# Patient Record
Sex: Male | Born: 1990 | Race: Black or African American | Hispanic: No | Marital: Single | Smoking: Never smoker
Health system: Southern US, Community
[De-identification: ages and names within clinical notes are randomized; demographics above are authoritative.]

---
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
## 2014-12-01 DIAGNOSIS — H9203 Otalgia, bilateral: Secondary | ICD-10-CM | POA: Insufficient documentation

## 2014-12-01 DIAGNOSIS — B349 Viral infection, unspecified: Secondary | ICD-10-CM | POA: Insufficient documentation

## 2014-12-02 ENCOUNTER — Emergency Department (HOSPITAL_BASED_OUTPATIENT_CLINIC_OR_DEPARTMENT_OTHER)
Admission: EM | Admit: 2014-12-02 | Discharge: 2014-12-02 | Disposition: A | Discharge status: Home / Self Care | Payer: Self-pay | Attending: Emergency Medicine | Admitting: Emergency Medicine

## 2014-12-02 ENCOUNTER — Encounter (HOSPITAL_BASED_OUTPATIENT_CLINIC_OR_DEPARTMENT_OTHER): Payer: Self-pay | Admitting: Emergency Medicine

## 2014-12-02 DIAGNOSIS — B349 Viral infection, unspecified: Secondary | ICD-10-CM

## 2014-12-02 MED ORDER — IBUPROFEN 800 MG PO TABS
800.0000 mg | ORAL_TABLET | Freq: Once | ORAL | Status: AC
  Administered 2014-12-02: 800 mg via ORAL
  Filled 2014-12-02: qty 1

## 2014-12-02 MED ORDER — ONDANSETRON 8 MG PO TBDP
8.0000 mg | ORAL_TABLET | Freq: Once | ORAL | Status: AC
  Administered 2014-12-02: 8 mg via ORAL
  Filled 2014-12-02: qty 1

## 2014-12-02 MED ORDER — FLUTICASONE PROPIONATE 50 MCG/ACT NA SUSP: 2.0000 | Freq: Every day | NASAL | Status: DC

## 2014-12-02 MED ORDER — ONDANSETRON 8 MG PO TBDP: ORAL_TABLET | ORAL | Status: DC

## 2014-12-02 MED ORDER — IBUPROFEN 800 MG PO TABS
800.0000 mg | ORAL_TABLET | Freq: Three times a day (TID) | ORAL | Status: DC
Start: 2014-12-02 — End: 2015-11-06

## 2014-12-02 NOTE — ED Notes (Signed)
Body aches, fatigue, fever, vomiting x4, ears ache, sore throat. Started 4/14.

## 2014-12-02 NOTE — ED Provider Notes (Addendum)
CSN: [id number]     Arrival date & time 12/01/14  2359 History   First MD Initiated Contact with Patient 12/02/14 0041     Chief Complaint  Patient presents with  . flu-like symptoms      (Consider location/radiation/quality/duration/timing/severity/associated sxs/prior Treatment) Patient is a 24 y.o. male presenting with ear pain. The history is provided by the patient.  Otalgia Location:  Bilateral Behind ear:  No abnormality Quality:  Aching Severity:  Moderate Onset quality:  Gradual Duration:  1 day Timing:  Constant Progression:  Unchanged Chronicity:  New Context: not direct blow   Relieved by:  Nothing Worsened by:  Nothing tried Ineffective treatments:  None tried Associated symptoms: diarrhea, sore throat and vomiting   Associated symptoms: no cough, no fever, no headaches, no neck pain and no rash   Risk factors: no recent travel     History reviewed. No pertinent past medical history. History reviewed. No pertinent past surgical history. History reviewed. No pertinent family history. History  Substance Use Topics  . Smoking status: Never Smoker   . Smokeless tobacco: Not on file  . Alcohol Use: No    Review of Systems  Constitutional: Negative for fever.  HENT: Positive for ear pain and sore throat. Negative for drooling.   Respiratory: Negative for cough.   Gastrointestinal: Positive for vomiting and diarrhea.  Musculoskeletal: Negative for neck pain.  Skin: Negative for rash.  Neurological: Negative for headaches.  All other systems reviewed and are negative.     Allergies  Review of patient's allergies indicates no known allergies.  Home Medications   Prior to Admission medications   Not on File   BP 146/82 mmHg  Pulse 72  Temp(Src) 98.4 F (36.9 C) (Oral)  Resp 16  Ht 6' (1.829 m)  Wt 205 lb (92.987 kg)  BMI 27.80 kg/m2  SpO2 100% Physical Exam  Constitutional: He is oriented to person, place, and time. He appears well-developed  and well-nourished. No distress.  HENT:  Head: Normocephalic and atraumatic.  Right Ear: External ear normal.  Left Ear: External ear normal.  Mouth/Throat: Oropharynx is clear and moist. No oropharyngeal exudate.  Eyes: Conjunctivae are normal. Pupils are equal, round, and reactive to light.  Neck: Normal range of motion. Neck supple. No tracheal deviation present.  Cardiovascular: Normal rate, regular rhythm and intact distal pulses.   Pulmonary/Chest: Effort normal and breath sounds normal. He has no wheezes. He has no rales.  Abdominal: Soft. Bowel sounds are normal. There is no tenderness. There is no rebound and no guarding.  Musculoskeletal: Normal range of motion. He exhibits no edema or tenderness.  Lymphadenopathy:    He has no cervical adenopathy.  Neurological: He is alert and oriented to person, place, and time.  Skin: Skin is warm and dry.  Psychiatric: He has a normal mood and affect.    ED Course  Procedures (including critical care time) Labs Review Labs Reviewed - No data to display  Imaging Review No results found.   EKG Interpretation None      MDM   Final diagnoses:  None    Viral syndrome, sick contacts at home.  Well appearing no indication for labs or imaging at this time.  Will treat symptomatically    Ramere Downs, MD 12/02/14 [phone]  Cy BlamerApril Tameron Lama, MD 12/02/14 [phone]

## 2015-08-14 ENCOUNTER — Encounter (HOSPITAL_BASED_OUTPATIENT_CLINIC_OR_DEPARTMENT_OTHER): Payer: Self-pay

## 2015-08-14 ENCOUNTER — Emergency Department (HOSPITAL_BASED_OUTPATIENT_CLINIC_OR_DEPARTMENT_OTHER)
Admission: EM | Admit: 2015-08-14 | Discharge: 2015-08-14 | Disposition: A | Discharge status: Home / Self Care | Payer: Self-pay | Attending: Emergency Medicine | Admitting: Emergency Medicine

## 2015-08-14 DIAGNOSIS — Z7951 Long term (current) use of inhaled steroids: Secondary | ICD-10-CM | POA: Insufficient documentation

## 2015-08-14 DIAGNOSIS — R112 Nausea with vomiting, unspecified: Secondary | ICD-10-CM | POA: Insufficient documentation

## 2015-08-14 DIAGNOSIS — R000 Tachycardia, unspecified: Secondary | ICD-10-CM | POA: Insufficient documentation

## 2015-08-14 DIAGNOSIS — R630 Anorexia: Secondary | ICD-10-CM | POA: Insufficient documentation

## 2015-08-14 DIAGNOSIS — R6883 Chills (without fever): Secondary | ICD-10-CM | POA: Insufficient documentation

## 2015-08-14 DIAGNOSIS — R1084 Generalized abdominal pain: Secondary | ICD-10-CM | POA: Insufficient documentation

## 2015-08-14 DIAGNOSIS — R197 Diarrhea, unspecified: Secondary | ICD-10-CM | POA: Insufficient documentation

## 2015-08-14 DIAGNOSIS — R5383 Other fatigue: Secondary | ICD-10-CM | POA: Insufficient documentation

## 2015-08-14 DIAGNOSIS — Z791 Long term (current) use of non-steroidal anti-inflammatories (NSAID): Secondary | ICD-10-CM | POA: Insufficient documentation

## 2015-08-14 LAB — URINALYSIS, ROUTINE W REFLEX MICROSCOPIC
BILIRUBIN URINE: NEGATIVE
Glucose, UA: NEGATIVE mg/dL
Hgb urine dipstick: NEGATIVE
KETONES UR: 15 mg/dL — AB
Leukocytes, UA: NEGATIVE
Nitrite: NEGATIVE
Protein, ur: NEGATIVE mg/dL
SPECIFIC GRAVITY, URINE: 1.035 — AB (ref 1.005–1.030)
pH: 6 (ref 5.0–8.0)

## 2015-08-14 LAB — CBC WITH DIFFERENTIAL/PLATELET
BASOS PCT: 0 %
Basophils Absolute: 0 10*3/uL (ref 0.0–0.1)
EOS ABS: 0 10*3/uL (ref 0.0–0.7)
Eosinophils Relative: 0 %
HCT: 45.9 % (ref 39.0–52.0)
Hemoglobin: 15.1 g/dL (ref 13.0–17.0)
LYMPHS ABS: 0.5 10*3/uL — AB (ref 0.7–4.0)
Lymphocytes Relative: 5 %
MCH: 26.8 pg (ref 26.0–34.0)
MCHC: 32.9 g/dL (ref 30.0–36.0)
MCV: 81.4 fL (ref 78.0–100.0)
MONOS PCT: 5 %
Monocytes Absolute: 0.6 10*3/uL (ref 0.1–1.0)
NEUTROS ABS: 10.6 10*3/uL — AB (ref 1.7–7.7)
NEUTROS PCT: 90 %
PLATELETS: 209 10*3/uL (ref 150–400)
RBC: 5.64 MIL/uL (ref 4.22–5.81)
RDW: 14.2 % (ref 11.5–15.5)
WBC: 11.8 10*3/uL — ABNORMAL HIGH (ref 4.0–10.5)

## 2015-08-14 LAB — COMPREHENSIVE METABOLIC PANEL
ALBUMIN: 4.1 g/dL (ref 3.5–5.0)
ALK PHOS: 87 U/L (ref 38–126)
ALT: 23 U/L (ref 17–63)
AST: 24 U/L (ref 15–41)
Anion gap: 5 (ref 5–15)
BUN: 17 mg/dL (ref 6–20)
CALCIUM: 9.3 mg/dL (ref 8.9–10.3)
CHLORIDE: 101 mmol/L (ref 101–111)
CO2: 28 mmol/L (ref 22–32)
CREATININE: 1.79 mg/dL — AB (ref 0.61–1.24)
GFR calc Af Amer: >60 mL/min (ref >60)
GFR calc non Af Amer: 52 mL/min — ABNORMAL LOW (ref >60)
GLUCOSE: 132 mg/dL — AB (ref 65–99)
Potassium: 4.3 mmol/L (ref 3.5–5.1)
SODIUM: 134 mmol/L — AB (ref 135–145)
Total Bilirubin: 0.7 mg/dL (ref 0.3–1.2)
Total Protein: 7.9 g/dL (ref 6.5–8.1)

## 2015-08-14 LAB — LIPASE, BLOOD: Lipase: 23 U/L (ref 11–51)

## 2015-08-14 MED ORDER — FENTANYL CITRATE (PF) 100 MCG/2ML IJ SOLN
100.0000 ug | Freq: Once | INTRAMUSCULAR | Status: AC
  Administered 2015-08-14: 100 ug via INTRAVENOUS
  Filled 2015-08-14: qty 2

## 2015-08-14 MED ORDER — SODIUM CHLORIDE 0.9 % IV BOLUS (SEPSIS)
1000.0000 mL | Freq: Once | INTRAVENOUS | Status: AC
  Administered 2015-08-14: 1000 mL via INTRAVENOUS

## 2015-08-14 MED ORDER — ONDANSETRON 8 MG PO TBDP: 8.0000 mg | ORAL_TABLET | Freq: Three times a day (TID) | ORAL | Status: DC | PRN

## 2015-08-14 MED ORDER — PROMETHAZINE HCL 25 MG PO TABS: 25.0000 mg | ORAL_TABLET | Freq: Four times a day (QID) | ORAL | Status: DC | PRN

## 2015-08-14 MED ORDER — ONDANSETRON HCL 4 MG/2ML IJ SOLN
4.0000 mg | Freq: Once | INTRAMUSCULAR | Status: AC
  Administered 2015-08-14: 4 mg via INTRAVENOUS
  Filled 2015-08-14: qty 2

## 2015-08-14 NOTE — Discharge Instructions (Signed)

## 2015-08-14 NOTE — ED Provider Notes (Signed)
CSN: [id number]     Arrival date & time 08/14/15  0535 History   First MD Initiated Contact with Patient 08/14/15 0544     Chief Complaint  Patient presents with  . Abdominal Pain     (Consider location/radiation/quality/duration/timing/severity/associated sxs/prior Treatment) Patient is a 24 y.o. male presenting with vomiting. The history is provided by the patient.  Emesis Associated symptoms: abdominal pain and diarrhea   Associated symptoms: no headaches    patient presents with nausea vomiting diarrhea. Began yesterday. Has diffuse abdominal pain also. States it hurts all over. Has had a decreased appetite. States he's had some chills. States his mom also began to vomit. No blood in the emesis. The pain is still constant. Not relieved with anything. States he is otherwise healthy.  History reviewed. No pertinent past medical history. Past Surgical History  Procedure Laterality Date  . Fracture surgery  1998    left hand   No family history on file. Social History  Substance Use Topics  . Smoking status: Never Smoker   . Smokeless tobacco: None  . Alcohol Use: No    Review of Systems  Constitutional: Positive for appetite change and fatigue. Negative for activity change.  Eyes: Negative for pain.  Respiratory: Negative for chest tightness and shortness of breath.   Cardiovascular: Negative for chest pain and leg swelling.  Gastrointestinal: Positive for nausea, vomiting, abdominal pain and diarrhea.  Genitourinary: Negative for flank pain.  Musculoskeletal: Negative for back pain and neck stiffness.  Skin: Negative for rash.  Neurological: Negative for weakness, numbness and headaches.  Psychiatric/Behavioral: Negative for behavioral problems.      Allergies  Review of patient's allergies indicates no known allergies.  Home Medications   Prior to Admission medications   Medication Sig Start Date End Date Taking? Authorizing Provider  fluticasone (FLONASE) 50  MCG/ACT nasal spray Place 2 sprays into both nostrils daily. 12/02/14   April Palumbo, MD  ibuprofen (ADVIL,MOTRIN) 800 MG tablet Take 1 tablet (800 mg total) by mouth 3 (three) times daily. 12/02/14   April Palumbo, MD  ondansetron (ZOFRAN-ODT) 8 MG disintegrating tablet Take 1 tablet (8 mg total) by mouth every 8 (eight) hours as needed for nausea or vomiting. 08/14/15   Benjiman CoreNathan Kailee Essman, MD  promethazine (PHENERGAN) 25 MG tablet Take 1 tablet (25 mg total) by mouth every 6 (six) hours as needed for nausea. 08/14/15   Benjiman CoreNathan Taiwan Millon, MD   BP 133/59 mmHg  Pulse 105  Temp(Src) 100.1 F (37.8 C) (Oral)  Resp 20  Ht 6' (1.829 m)  Wt 230 lb (104.327 kg)  BMI 31.19 kg/m2  SpO2 100% Physical Exam  Constitutional: He appears well-developed.  HENT:  Head: Atraumatic.  Eyes: Pupils are equal, round, and reactive to light.  Neck: Neck supple.  Cardiovascular:  Tachycardia  Pulmonary/Chest: Effort normal.  Abdominal:  Diffusely tender. Some voluntary guarding.  Musculoskeletal: Normal range of motion.  Neurological: He is alert.  Skin: Skin is warm.  Nursing note and vitals reviewed.   ED Course  Procedures (including critical care time) Labs Review Labs Reviewed  CBC WITH DIFFERENTIAL/PLATELET - Abnormal; Notable for the following:    WBC 11.8 (*)    Neutro Abs 10.6 (*)    Lymphs Abs 0.5 (*)    All other components within normal limits  COMPREHENSIVE METABOLIC PANEL - Abnormal; Notable for the following:    Sodium 134 (*)    Glucose, Bld 132 (*)    Creatinine, Ser 1.79 (*)  GFR calc non Af Amer 52 (*)    All other components within normal limits  URINALYSIS, ROUTINE W REFLEX MICROSCOPIC (NOT AT [facility]) - Abnormal; Notable for the following:    Specific Gravity, Urine 1.035 (*)    Ketones, ur 15 (*)    All other components within normal limits  LIPASE, BLOOD    Imaging Review No results found. I have personally reviewed and evaluated these images and lab results as part  of my medical decision-making.   EKG Interpretation None      MDM   Final diagnoses:  Nausea vomiting and diarrhea    Patient nausea vomiting diarrhea. Mild dehydration. Feels better after treatment. Will discharge home.    Benjiman Core, MD 08/15/15 [phone]

## 2015-08-14 NOTE — ED Notes (Signed)
Pt c/o n/v/d, headache and body aches since yesterday

## 2015-11-06 ENCOUNTER — Encounter (HOSPITAL_BASED_OUTPATIENT_CLINIC_OR_DEPARTMENT_OTHER): Payer: Self-pay | Admitting: *Deleted

## 2015-11-06 ENCOUNTER — Emergency Department (HOSPITAL_BASED_OUTPATIENT_CLINIC_OR_DEPARTMENT_OTHER)
Admission: EM | Admit: 2015-11-06 | Discharge: 2015-11-06 | Disposition: A | Discharge status: Home / Self Care | Payer: Self-pay | Attending: Emergency Medicine | Admitting: Emergency Medicine

## 2015-11-06 DIAGNOSIS — A6002 Herpesviral infection of other male genital organs: Secondary | ICD-10-CM | POA: Insufficient documentation

## 2015-11-06 MED ORDER — IBUPROFEN 600 MG PO TABS: 600.0000 mg | ORAL_TABLET | Freq: Four times a day (QID) | ORAL | Status: DC | PRN

## 2015-11-06 MED ORDER — ACYCLOVIR 400 MG PO TABS: 400.0000 mg | ORAL_TABLET | Freq: Three times a day (TID) | ORAL | Status: DC

## 2015-11-06 NOTE — Discharge Instructions (Signed)
Sexually Transmitted Disease °A sexually transmitted disease (STD) is a disease or infection that may be passed (transmitted) from person to person, usually during sexual activity. This may happen by way of saliva, semen, blood, vaginal mucus, or urine. Common STDs include: °· Gonorrhea. °· Chlamydia. °· Syphilis. °· HIV and AIDS. °· Genital herpes. °· Hepatitis B and C. °· Trichomonas. °· Human papillomavirus (HPV). °· Pubic lice. °· Scabies. °· Mites. °· Bacterial vaginosis. °WHAT ARE CAUSES OF STDs? °An STD may be caused by bacteria, a virus, or parasites. STDs are often transmitted during sexual activity if one person is infected. However, they may also be transmitted through nonsexual means. STDs may be transmitted after:  °· Sexual intercourse with an infected person. °· Sharing sex toys with an infected person. °· Sharing needles with an infected person or using unclean piercing or tattoo needles. °· Having intimate contact with the genitals, mouth, or rectal areas of an infected person. °· Exposure to infected fluids during birth. °WHAT ARE THE SIGNS AND SYMPTOMS OF STDs? °Different STDs have different symptoms. Some people may not have any symptoms. If symptoms are present, they may include: °· Painful or bloody urination. °· Pain in the pelvis, abdomen, vagina, anus, throat, or eyes. °· A skin rash, itching, or irritation. °· Growths, ulcerations, blisters, or sores in the genital and anal areas. °· Abnormal vaginal discharge with or without bad odor. °· Penile discharge in men. °· Fever. °· Pain or bleeding during sexual intercourse. °· Swollen glands in the groin area. °· Yellow skin and eyes (jaundice). This is seen with hepatitis. °· Swollen testicles. °· Infertility. °· Sores and blisters in the mouth. °HOW ARE STDs DIAGNOSED? °To make a diagnosis, your health care provider may: °· Take a medical history. °· Perform a physical exam. °· Take a sample of any discharge to examine. °· Swab the throat,  cervix, opening to the penis, rectum, or vagina for testing. °· Test a sample of your first morning urine. °· Perform blood tests. °· Perform a Pap test, if this applies. °· Perform a colposcopy. °· Perform a laparoscopy. °HOW ARE STDs TREATED? °Treatment depends on the STD. Some STDs may be treated but not cured. °· Chlamydia, gonorrhea, trichomonas, and syphilis can be cured with antibiotic medicine. °· Genital herpes, hepatitis, and HIV can be treated, but not cured, with prescribed medicines. The medicines lessen symptoms. °· Genital warts from HPV can be treated with medicine or by freezing, burning (electrocautery), or surgery. Warts may come back. °· HPV cannot be cured with medicine or surgery. However, abnormal areas may be removed from the cervix, vagina, or vulva. °· If your diagnosis is confirmed, your recent sexual partners need treatment. This is true even if they are symptom-free or have a negative culture or evaluation. They should not have sex until their health care providers say it is okay. °· Your health care provider may test you for infection again 3 months after treatment. °HOW CAN I REDUCE MY RISK OF GETTING AN STD? °Take these steps to reduce your risk of getting an STD: °· Use latex condoms, dental dams, and water-soluble lubricants during sexual activity. Do not use petroleum jelly or oils. °· Avoid having multiple sex partners. °· Do not have sex with someone who has other sex partners °· Do not have sex with anyone you do not know or who is at high risk for an STD. °· Avoid risky sex practices that can break your skin. °· Do not have sex   if you have open sores on your mouth or skin. °· Avoid drinking too much alcohol or taking illegal drugs. Alcohol and drugs can affect your judgment and put you in a vulnerable position. °· Avoid engaging in oral and anal sex acts. °· Get vaccinated for HPV and hepatitis. If you have not received these vaccines in the past, talk to your health care  provider about whether one or both might be right for you. °· If you are at risk of being infected with HIV, it is recommended that you take a prescription medicine daily to prevent HIV infection. This is called pre-exposure prophylaxis (PrEP). You are considered at risk if: °· You are a man who has sex with other men (MSM). °· You are a heterosexual man or woman and are sexually active with more than one partner. °· You take drugs by injection. °· You are sexually active with a partner who has HIV. °· Talk with your health care provider about whether you are at high risk of being infected with HIV. If you choose to begin PrEP, you should first be tested for HIV. You should then be tested every 3 months for as long as you are taking PrEP. °WHAT SHOULD I DO IF I THINK I HAVE AN STD? °· See your health care provider. °· Tell your sexual partner(s). They should be tested and treated for any STDs. °· Do not have sex until your health care provider says it is okay. °WHEN SHOULD I GET IMMEDIATE MEDICAL CARE? °Contact your health care provider right away if:  °· You have severe abdominal pain. °· You are a man and notice swelling or pain in your testicles. °· You are a woman and notice swelling or pain in your vagina. °  °This information is not intended to replace advice given to you by your health care provider. Make sure you discuss any questions you have with your health care provider. °  °Document Released: 10/26/2002 Document Revised: 08/26/2014 Document Reviewed: 02/23/2013 °Elsevier Interactive Patient Education ©2016 Else[employer] Inc[employer] ° ° °Genital Herpes °Genital herpes is a common sexually transmitted infection (STI) that is caused by a virus. The virus is spread from person to person through sexual contact. Infection can cause itching, blisters, and sores in the genital area or rectal area. This is called an outbreak. It affects both men and women. °Genital herpes is particularly concerning for pregnant women  because the virus can be passed to the baby during delivery and cause serious problems. Genital herpes is also a concern for people with a weakened defense (immune) system. °Symptoms of genital herpes may last several days and then go away. However, the virus remains in your body, so you may have more outbreaks of symptoms in the future. The time between outbreaks varies and can be months or years. °CAUSES °Genital herpes is caused by a virus called herpes simplex virus (HSV) type 2 or HSV type 1. These viruses are contagious and are most often spread through sexual contact with an infected person. Sexual contact includes vaginal, anal, and oral sex. °RISK FACTORS °Risk factors for genital herpes include: °· Being sexually active with multiple partners. °· Having unprotected sex. °SIGNS AND SYMPTOMS °Symptoms may include: °· Pain and itching in the genital area or rectal area. °· Small red bumps that turn into blisters and then turn into sores. °· Flu-like symptoms, including: °¨ Fever. °¨ Body aches. °· Painful urination. °· Vaginal discharge. °DIAGNOSIS °Genital herpes may be diagnosed by: °· Physical   Blood test.  Fluid culture test from an open sore. TREATMENT There is no cure for genital herpes. Oral antiviral medicines may be used to speed up healing and to help prevent the return of symptoms. These medicines can also help to reduce the spread of the virus to sexual partners. HOME CARE INSTRUCTIONS  Keep the affected areas dry and clean.  Take medicines only as directed by your health care provider.  Do not have sexual contact during active infections. Genital herpes is contagious.  Practice safe sex. Latex condoms and male condoms may help to prevent the spread of the herpes virus.  Avoid rubbing or touching the blisters and sores. If you do touch the blister or sores:  Wash your hands thoroughly.  Do not touch your eyes afterward.  If you become pregnant, tell your health care provider  if you have had genital herpes.  Keep all follow-up visits as directed by your health care provider. This is important. PREVENTION  Use condoms. Although anyone can contract genital herpes during sexual contact even with the use of a condom, a condom can provide some protection.  Avoid having multiple sexual partners.  Talk to your sexual partner about any symptoms and past history that either of you may have.  Get tested before you have sex. Ask your partner to do the same.  Recognize the symptoms of genital herpes. Do not have sexual contact if you notice these symptoms. SEEK MEDICAL CARE IF:  Your symptoms are not improving with medicine.  Your symptoms return.  You have new symptoms.  You have a fever.  You have abdominal pain.  You have redness, swelling, or pain in your eye. MAKE SURE YOU:  Understand these instructions.  Will watch your condition.  Will get help right away if you are not doing well or get worse.   This information is not intended to replace advice given to you by your health care provider. Make sure you discuss any questions you have with your health care provider.   Document Released: 08/02/2000 Document Revised: 08/26/2014 Document Reviewed: 12/21/2013 Elsevier Interactive Patient Education [employer]2016 Elsevier Inc.

## 2015-11-06 NOTE — ED Provider Notes (Signed)
CSN: [id number]     Arrival date & time 11/06/15  0750 History   First MD Initiated Contact with Patient 11/06/15 [phone]     Chief Complaint  Patient presents with  . Exposure to STD     (Consider location/radiation/quality/duration/timing/severity/associated sxs/prior Treatment) HPI Patient presents with painful lumps to the base of the penis for 3 days. No similar symptoms. Denies penile discharge, testicular swelling or tenderness. Denies fever or chills. No abdominal pain, nausea or vomiting. Patient states his fiance recently told him that she was sleeping with other men. He is requesting screening for STDs. History reviewed. No pertinent past medical history. Past Surgical History  Procedure Laterality Date  . Fracture surgery  1998    left hand   History reviewed. No pertinent family history. Social History  Substance Use Topics  . Smoking status: Never Smoker   . Smokeless tobacco: None  . Alcohol Use: No    Review of Systems  Constitutional: Negative for fever and chills.  Gastrointestinal: Negative for nausea, vomiting and abdominal pain.  Genitourinary: Positive for penile pain. Negative for flank pain, discharge, scrotal swelling and testicular pain.  Musculoskeletal: Negative for myalgias.  Skin: Positive for rash.  Neurological: Negative for dizziness, weakness, light-headedness and headaches.  All other systems reviewed and are negative.     Allergies  Review of patient's allergies indicates no known allergies.  Home Medications   Prior to Admission medications   Medication Sig Start Date End Date Taking? Authorizing Provider  acyclovir (ZOVIRAX) 400 MG tablet Take 1 tablet (400 mg total) by mouth 3 (three) times daily. 11/06/15   Loren Raceravid Yakelin Grenier, MD  ibuprofen (ADVIL,MOTRIN) 600 MG tablet Take 1 tablet (600 mg total) by mouth every 6 (six) hours as needed. 11/06/15   Loren Raceravid Cheila Wickstrom, MD   BP 137/91 mmHg  Pulse 68  Temp(Src) 97.6 F (36.4 C) (Oral)   Resp 16  Ht 6' (1.829 m)  Wt 225 lb (102.059 kg)  BMI 30.51 kg/m2  SpO2 98% Physical Exam  Constitutional: He is oriented to person, place, and time. He appears well-developed and well-nourished. No distress.  HENT:  Head: Normocephalic and atraumatic.  Mouth/Throat: Oropharynx is clear and moist.  Eyes: EOM are normal. Pupils are equal, round, and reactive to light.  Neck: Normal range of motion. Neck supple.  Cardiovascular: Normal rate and regular rhythm.   Pulmonary/Chest: Effort normal and breath sounds normal.  Abdominal: Soft. Bowel sounds are normal. He exhibits no distension and no mass. There is no tenderness. There is no rebound and no guarding.  Genitourinary:  Patient has multiple small ulcerated lesions to the base of the penis. There is mild swelling and redness. No penile discharge. Inguinal lymphadenopathy noted. No testicular swelling or tenderness.  Musculoskeletal: Normal range of motion. He exhibits no edema or tenderness.  Neurological: He is alert and oriented to person, place, and time.  Skin: Skin is warm and dry. No rash noted. No erythema.  Psychiatric: He has a normal mood and affect. His behavior is normal.  Nursing note and vitals reviewed.   ED Course  Procedures (including critical care time) Labs Review Labs Reviewed  RPR  HIV ANTIBODY (ROUTINE TESTING)  GC/CHLAMYDIA PROBE AMP ([place]    Imaging Review No results found. I have personally reviewed and evaluated these images and lab results as part of my medical decision-making.   EKG Interpretation None      MDM   Final diagnoses:  Herpes genitalis in  men    Concern for herpes genitalis. We'll screen for other STDs. Will start acyclovir and given information regarding herpes and the need to use barrier protection.   Loren Racer, MD 11/06/15 [phone]

## 2015-11-06 NOTE — ED Notes (Signed)
GC swab at bedside.

## 2015-11-06 NOTE — ED Notes (Signed)
Pt reports his fiancee was unfaithful, she is currently being tested for std's and he requests same, he reports several "bumps" on his penis, denies any drainage, since Saturday morning.

## 2015-11-06 NOTE — ED Notes (Signed)
Pt refused blood work stated he had HIV testing done yesterday.

## 2015-11-06 NOTE — ED Notes (Signed)
MD at bedside. 

## 2015-11-06 NOTE — ED Notes (Signed)
Pt declines hiv and gc testing, states "I already had all that done, I just wanted you to look at these bumps.Marland Kitchen.Marland Kitchen."

## 2016-01-28 ENCOUNTER — Emergency Department (HOSPITAL_BASED_OUTPATIENT_CLINIC_OR_DEPARTMENT_OTHER): Payer: Self-pay

## 2016-01-28 ENCOUNTER — Emergency Department (HOSPITAL_BASED_OUTPATIENT_CLINIC_OR_DEPARTMENT_OTHER)
Admission: EM | Admit: 2016-01-28 | Discharge: 2016-01-28 | Disposition: A | Discharge status: Home / Self Care | Payer: Self-pay | Attending: Emergency Medicine | Admitting: Emergency Medicine

## 2016-01-28 ENCOUNTER — Encounter (HOSPITAL_BASED_OUTPATIENT_CLINIC_OR_DEPARTMENT_OTHER): Payer: Self-pay | Admitting: Emergency Medicine

## 2016-01-28 DIAGNOSIS — W03XXXA Other fall on same level due to collision with another person, initial encounter: Secondary | ICD-10-CM | POA: Insufficient documentation

## 2016-01-28 DIAGNOSIS — Y999 Unspecified external cause status: Secondary | ICD-10-CM | POA: Insufficient documentation

## 2016-01-28 DIAGNOSIS — Y929 Unspecified place or not applicable: Secondary | ICD-10-CM | POA: Insufficient documentation

## 2016-01-28 DIAGNOSIS — S93602A Unspecified sprain of left foot, initial encounter: Secondary | ICD-10-CM | POA: Insufficient documentation

## 2016-01-28 DIAGNOSIS — Y9361 Activity, american tackle football: Secondary | ICD-10-CM | POA: Insufficient documentation

## 2016-01-28 MED ORDER — METHOCARBAMOL 500 MG PO TABS: 500.0000 mg | ORAL_TABLET | Freq: Two times a day (BID) | ORAL | Status: DC

## 2016-01-28 MED ORDER — IBUPROFEN 800 MG PO TABS: 800.0000 mg | ORAL_TABLET | Freq: Three times a day (TID) | ORAL | Status: DC

## 2016-01-28 MED ORDER — IBUPROFEN 800 MG PO TABS
800.0000 mg | ORAL_TABLET | Freq: Once | ORAL | Status: AC
  Administered 2016-01-28: 800 mg via ORAL
  Filled 2016-01-28: qty 1

## 2016-01-28 NOTE — Discharge Instructions (Signed)
Foot Sprain °A foot sprain is an injury to one of the strong bands of tissue (ligaments) that connect and support the many bones in your feet. The ligament can be stretched too much or it can tear. A tear can be either partial or complete. The severity of the sprain depends on how much of the ligament was damaged or torn. °CAUSES °A foot sprain is usually caused by suddenly twisting or pivoting your foot. °RISK FACTORS °This injury is more likely to occur in people who: °· Play a sport, such as basketball or football. °· Exercise or play a sport without warming up. °· Start a new workout or sport. °· Suddenly increase how long or hard they exercise or play a sport. °SYMPTOMS °Symptoms of this condition start soon after an injury and include: °· Pain, especially in the arch of the foot. °· Bruising. °· Swelling. °· Inability to walk or use the foot to support body weight. °DIAGNOSIS °This condition is diagnosed with a medical history and physical exam. You may also have imaging tests, such as: °· X-rays to make sure there are no broken bones (fractures). °· MRI to see if the ligament has torn. °TREATMENT °Treatment varies depending on the severity of your sprain. Mild sprains can be treated with rest, ice, compression, and elevation (RICE). If your ligament is overstretched or partially torn, treatment usually involves keeping your foot in a fixed position (immobilization) for a period of time. To help you do this, your health care provider will apply a bandage, splint, or walking boot to keep your foot from moving until it heals. You may also be advised to use crutches or a scooter for a few weeks to avoid bearing weight on your foot while it is healing. °If your ligament is fully torn, you may need surgery to reconnect the ligament to the bone. After surgery, a cast or splint will be applied and will need to stay on your foot while it heals. °Your health care provider may also suggest exercises or physical therapy  to strengthen your foot. °HOME CARE INSTRUCTIONS °If You Have a Bandage, Splint, or Walking Boot: °· Wear it as directed by your health care provider. Remove it only as directed by your health care provider. °· Loosen the bandage, splint, or walking boot if your toes become numb and tingle, or if they turn cold and blue. °Bathing °· If your health care provider approves bathing and showering, cover the bandage or splint with a watertight plastic bag to protect it from water. Do not let the bandage or splint get wet. °Managing Pain, Stiffness, and Swelling  °· If directed, apply ice to the injured area: °¨ Put ice in a plastic bag. °¨ Place a towel between your skin and the bag. °¨ Leave the ice on for 20 minutes, 2-3 times per day. °· Move your toes often to avoid stiffness and to lessen swelling. °· Raise (elevate) the injured area above the level of your heart while you are sitting or lying down. °Driving °· Do not drive or operate heavy machinery while taking pain medicine. °· Do not drive while wearing a bandage, splint, or walking boot on a foot that you use for driving. °Activity °· Rest as directed by your health care provider. °· Do not use the injured foot to support your body weight until your health care provider says that you can. Use crutches or other supportive devices as directed by your health care provider. °· Ask your health care   provider what activities are safe for you. Gradually increase how much and how far you walk until your health care provider says it is safe to return to full activity. °· Do any exercise or physical therapy as directed by your health care provider. °General Instructions °· If a splint was applied, do not put pressure on any part of it until it is fully hardened. This may take several hours. °· Take medicines only as directed by your health care provider. These include over-the-counter medicines and prescription medicines. °· Keep all follow-up visits as directed by your  health care provider. This is important. °· When you can walk without pain, wear supportive shoes that have stiff soles. Do not wear flip-flops, and do not walk barefoot. °SEEK MEDICAL CARE IF: °· Your pain is not controlled with medicine. °· Your bruising or swelling gets worse or does not get better with treatment. °· Your splint or walking boot is damaged. °SEEK IMMEDIATE MEDICAL CARE IF: °· Your foot is numb or blue. °· Your foot feels colder than normal. °  °This information is not intended to replace advice given to you by your health care provider. Make sure you discuss any questions you have with your health care provider. °  °Document Released: 01/25/2002 Document Revised: 12/20/2014 Document Reviewed: 06/08/2014 °Elsevier Interactive Patient Education ©2016 [employer] ° °Elastic Bandage and RICE °WHAT DOES AN ELASTIC BANDAGE DO? °Elastic bandages come in different shapes and sizes. They generally provide support to your injury and reduce swelling while you are healing, but they can perform different functions. Your health care provider will help you to decide what is best for your protection, recovery, or rehabilitation following an injury. °WHAT ARE SOME GENERAL TIPS FOR USING AN ELASTIC BANDAGE? °· Use the bandage as directed by the maker of the bandage that you are using. °· Do not wrap the bandage too tightly. This may cut off the circulation in the arm or leg in the area below the bandage. °¨ If part of your body beyond the bandage becomes blue, numb, cold, swollen, or is more painful, your bandage is most likely too tight. If this occurs, remove your bandage and reapply it more loosely. °· See your health care provider if the bandage seems to be making your problems worse rather than better. °· An elastic bandage should be removed and reapplied every 3-4 hours or as directed by your health care provider. °WHAT IS RICE? °The routine care of many injuries includes rest, ice, compression, and  elevation (RICE therapy).  °Rest °Rest is required to allow your body to heal. Generally, you can resume your routine activities when you are comfortable and have been given permission by your health care provider. °Ice °Icing your injury helps to keep the swelling down and it reduces pain. Do not apply ice directly to your skin. °· Put ice in a plastic bag. °· Place a towel between your skin and the bag. °· Leave the ice on for 20 minutes, 2-3 times per day. °Do this for as long as you are directed by your health care provider. °Compression °Compression helps to keep swelling down, gives support, and helps with discomfort. Compression may be done with an elastic bandage. °Elevation °Elevation helps to reduce swelling and it decreases pain. If possible, your injured area should be placed at or above the level of your heart or the center of your chest. °WHEN SHOULD I SEEK MEDICAL CARE? °You should seek medical care if: °· You have persistent   pain and swelling. °· Your symptoms are getting worse rather than improving. °These symptoms may indicate that further evaluation or further X-rays are needed. Sometimes, X-rays may not show a small broken bone (fracture) until a number of days later. Make a follow-up appointment with your health care provider. Ask when your X-ray results will be ready. Make sure that you get your X-ray results. °WHEN SHOULD I SEEK IMMEDIATE MEDICAL CARE? °You should seek immediate medical care if: °· You have a sudden onset of severe pain at or below the area of your injury. °· You develop redness or increased swelling around your injury. °· You have tingling or numbness at or below the area of your injury that does not improve after you remove the elastic bandage. °  °This information is not intended to replace advice given to you by your health care provider. Make sure you discuss any questions you have with your health care provider. °  °Document Released: 01/25/2002 Document Revised:  04/26/2015 Document Reviewed: 03/21/2014 °Elsevier Interactive Patient Education ©2016 [employer] ° °

## 2016-01-28 NOTE — ED Provider Notes (Signed)
CSN: [id number]     Arrival date & time 01/28/16  1252 History   First MD Initiated Contact with Patient 01/28/16 1406     Chief Complaint  Patient presents with  . Foot Pain     (Consider location/radiation/quality/duration/timing/severity/associated sxs/prior Treatment) HPI   25 year old male presents for evaluation of foot pain. Patient states he was playing football last night and was tackled while he was going for a ball.  He injured his L foot as it went up under him. Patient report 10 out of 10 sharp pain to the heel of his foot worsening with palpation or with movement. He was able to ambulate using the ball of his foot only. He is using ice and he with minimal improvement. No complaints of knee or hip pain. He did not hit his head or loss of consciousness. He reports some ankle pain near the heel of his foot.  History reviewed. No pertinent past medical history. Past Surgical History  Procedure Laterality Date  . Fracture surgery  1998    left hand   History reviewed. No pertinent family history. Social History  Substance Use Topics  . Smoking status: Never Smoker   . Smokeless tobacco: None  . Alcohol Use: No    Review of Systems  Constitutional: Negative for fever.  Musculoskeletal: Positive for arthralgias.  Skin: Negative for wound.  Neurological: Negative for numbness.      Allergies  Review of patient's allergies indicates no known allergies.  Home Medications   Prior to Admission medications   Not on File   BP 129/77 mmHg  Pulse 72  Temp(Src) 98.1 F (36.7 C) (Oral)  Resp 18  Ht 6' (1.829 m)  Wt 97.523 kg  BMI 29.15 kg/m2  SpO2 100% Physical Exam  Constitutional: He appears well-developed and well-nourished. No distress.  HENT:  Head: Atraumatic.  Eyes: Conjunctivae are normal.  Neck: Neck supple.  Cardiovascular: Intact distal pulses.   Musculoskeletal: He exhibits tenderness (Left foot: Tenderness noted to the calcaneal aspects of the  foot on palpation without any bruising, swelling, or deformity noted. Tenderness to medial malleolus region on palpation without crepitus. Normal ankle dorsiflexion and plantar flexion. No crepitu).  Left knee and left hip are Nontender to palpation with full range of motion.  Neurological: He is alert.  Skin: No rash noted.  Psychiatric: He has a normal mood and affect.  Nursing note and vitals reviewed.   ED Course  Procedures (including critical care time) Labs Review Labs Reviewed - No data to display  Imaging Review Dg Foot Complete Left  01/28/2016  CLINICAL DATA:  25 year old male with left foot heel pain. Sustained an injury playing football yesterday. EXAM: LEFT FOOT - COMPLETE 3+ VIEW COMPARISON:  None. FINDINGS: There is no evidence of fracture or dislocation. There is no evidence of arthropathy or other focal bone abnormality. Soft tissues are unremarkable. IMPRESSION: Negative. Electronically Signed   By: Malachy MoanHeath  McCullough M.D.   On: 01/28/2016 13:47   I have personally reviewed and evaluated these images and lab results as part of my medical decision-making.   EKG Interpretation None      MDM   Final diagnoses:  Foot sprain, left, initial encounter    BP 129/77 mmHg  Pulse 72  Temp(Src) 98.1 F (36.7 C) (Oral)  Resp 18  Ht 6' (1.829 m)  Wt 97.523 kg  BMI 29.15 kg/m2  SpO2 100%  2:22 PM Patient sustained a heel injury to his left foot while playing  football yesterday. X-ray of left foot shows no acute fractures or dislocation. He does have some mild tenderness to his medial malleolus region but I have low suspicion for ankle fractures or dislocation. He is able to ambulate. ASO provided stability and support, rice therapy discussed. Orthopedic referral given as needed. Return precaution discussed.    Fayrene Helper, PA-C 01/28/16 1423  Lavera Guise, MD 01/28/16 1945

## 2016-01-28 NOTE — ED Notes (Addendum)
Pt playing football last pm, was tackled going for ball, injured left foot as it went up under him, pt went home, iced/heat, last pm, this am awoke and unable to walk or apply pressure to bottom of foot near heel

## 2017-09-23 ENCOUNTER — Emergency Department (HOSPITAL_BASED_OUTPATIENT_CLINIC_OR_DEPARTMENT_OTHER)
Admission: EM | Admit: 2017-09-23 | Discharge: 2017-09-23 | Disposition: A | Discharge status: Home / Self Care | Payer: Self-pay | Attending: Emergency Medicine | Admitting: Emergency Medicine

## 2017-09-23 ENCOUNTER — Emergency Department (HOSPITAL_BASED_OUTPATIENT_CLINIC_OR_DEPARTMENT_OTHER): Payer: Self-pay

## 2017-09-23 ENCOUNTER — Other Ambulatory Visit: Payer: Self-pay

## 2017-09-23 ENCOUNTER — Encounter (HOSPITAL_BASED_OUTPATIENT_CLINIC_OR_DEPARTMENT_OTHER): Payer: Self-pay | Admitting: Emergency Medicine

## 2017-09-23 DIAGNOSIS — R110 Nausea: Secondary | ICD-10-CM | POA: Insufficient documentation

## 2017-09-23 DIAGNOSIS — R1084 Generalized abdominal pain: Secondary | ICD-10-CM

## 2017-09-23 DIAGNOSIS — R197 Diarrhea, unspecified: Secondary | ICD-10-CM

## 2017-09-23 LAB — HEPATIC FUNCTION PANEL
ALK PHOS: 80 U/L (ref 38–126)
ALT: 16 U/L — AB (ref 17–63)
AST: 21 U/L (ref 15–41)
Albumin: 4 g/dL (ref 3.5–5.0)
Bilirubin, Direct: <0.1 mg/dL — ABNORMAL LOW (ref 0.1–0.5)
TOTAL PROTEIN: 7.5 g/dL (ref 6.5–8.1)
Total Bilirubin: 0.6 mg/dL (ref 0.3–1.2)

## 2017-09-23 LAB — URINALYSIS, ROUTINE W REFLEX MICROSCOPIC
Bilirubin Urine: NEGATIVE
Glucose, UA: NEGATIVE mg/dL
Hgb urine dipstick: NEGATIVE
Ketones, ur: 15 mg/dL — AB
LEUKOCYTES UA: NEGATIVE
NITRITE: NEGATIVE
PROTEIN: NEGATIVE mg/dL
Specific Gravity, Urine: >1.03 — ABNORMAL HIGH (ref 1.005–1.030)
pH: 6 (ref 5.0–8.0)

## 2017-09-23 LAB — CBC WITH DIFFERENTIAL/PLATELET
BASOS ABS: 0 10*3/uL (ref 0.0–0.1)
BASOS PCT: 0 %
Eosinophils Absolute: 0.1 10*3/uL (ref 0.0–0.7)
Eosinophils Relative: 1 %
HCT: 46.8 % (ref 39.0–52.0)
Hemoglobin: 15.5 g/dL (ref 13.0–17.0)
LYMPHS PCT: 10 %
Lymphs Abs: 0.9 10*3/uL (ref 0.7–4.0)
MCH: 27.1 pg (ref 26.0–34.0)
MCHC: 33.1 g/dL (ref 30.0–36.0)
MCV: 81.8 fL (ref 78.0–100.0)
MONO ABS: 0.6 10*3/uL (ref 0.1–1.0)
Monocytes Relative: 6 %
Neutro Abs: 7.6 10*3/uL (ref 1.7–7.7)
Neutrophils Relative %: 83 %
PLATELETS: 210 10*3/uL (ref 150–400)
RBC: 5.72 MIL/uL (ref 4.22–5.81)
RDW: 14 % (ref 11.5–15.5)
WBC: 9.1 10*3/uL (ref 4.0–10.5)

## 2017-09-23 LAB — I-STAT CHEM 8, ED
BUN: 18 mg/dL (ref 6–20)
Calcium, Ion: 1.18 mmol/L (ref 1.15–1.40)
Chloride: 103 mmol/L (ref 101–111)
Creatinine, Ser: 1.5 mg/dL — ABNORMAL HIGH (ref 0.61–1.24)
Glucose, Bld: 88 mg/dL (ref 65–99)
HEMATOCRIT: 48 % (ref 39.0–52.0)
HEMOGLOBIN: 16.3 g/dL (ref 13.0–17.0)
POTASSIUM: 4.4 mmol/L (ref 3.5–5.1)
Sodium: 140 mmol/L (ref 135–145)
TCO2: 28 mmol/L (ref 22–32)

## 2017-09-23 LAB — LIPASE, BLOOD: LIPASE: 29 U/L (ref 11–51)

## 2017-09-23 MED ORDER — ONDANSETRON 4 MG PO TBDP: 4.0000 mg | ORAL_TABLET | Freq: Three times a day (TID) | ORAL | 0 refills | Status: AC | PRN

## 2017-09-23 MED ORDER — ONDANSETRON 4 MG PO TBDP
4.0000 mg | ORAL_TABLET | Freq: Once | ORAL | Status: AC
  Administered 2017-09-23: 4 mg via ORAL
  Filled 2017-09-23: qty 1

## 2017-09-23 MED ORDER — DICYCLOMINE HCL 20 MG PO TABS: 20.0000 mg | ORAL_TABLET | Freq: Two times a day (BID) | ORAL | 0 refills | Status: AC

## 2017-09-23 MED ORDER — SODIUM CHLORIDE 0.9 % IV BOLUS (SEPSIS)
1000.0000 mL | Freq: Once | INTRAVENOUS | Status: AC
  Administered 2017-09-23: 1000 mL via INTRAVENOUS

## 2017-09-23 MED ORDER — DICYCLOMINE HCL 10 MG PO CAPS
10.0000 mg | ORAL_CAPSULE | Freq: Once | ORAL | Status: AC
  Administered 2017-09-23: 10 mg via ORAL
  Filled 2017-09-23: qty 1

## 2017-09-23 MED ORDER — GI COCKTAIL ~~LOC~~
30.0000 mL | Freq: Once | ORAL | Status: AC
  Administered 2017-09-23: 30 mL via ORAL
  Filled 2017-09-23: qty 30

## 2017-09-23 MED ORDER — FAMOTIDINE 20 MG PO TABS: 20.0000 mg | ORAL_TABLET | Freq: Once | ORAL | Status: DC

## 2017-09-23 NOTE — Discharge Instructions (Signed)
Your abdominal pain is likely from gastritis, reflux or a stomach ulcer. You will need to take the prescribed proton pump inhibitor as directed, and avoid spicy/fatty/acidic foods. Avoid laying down flat within 30 minutes of eating. Avoid NSAIDs like ibuprofen or Aleve on an empty stomach. Use zofran as needed for nausea. Return to the ER for new or worsening symptoms, any additional concers.  Your kidney function is slightly elevated which is likely due to dehydration.  Make sure that you are drinking plenty of fluids.  This will need to be rechecked in 1 week to make sure that it is resolving.  It is also very important that you follow-up with a primary care doctor.  We will give you some resources.  Return to the ED with any worsening symptoms.   SEEK IMMEDIATE MEDICAL ATTENTION IF YOU DEVELOP ANY OF THE FOLLOWING SYMPTOMS: The pain does not go away or becomes severe.  A temperature above 101 develops.  Repeated vomiting occurs (multiple episodes).  Blood is being passed in stools or vomit (bright red or black tarry stools).  Return also if you develop chest pain, difficulty breathing, dizziness or fainting

## 2017-09-23 NOTE — ED Provider Notes (Signed)
[facility] EMERGENCY DEPARTMENT Provider Note   CSN: [id number] Arrival date & time: 09/23/17  0802     History   Chief Complaint Chief Complaint  Patient presents with  . Abdominal Pain    HPI Alan Foster is a 27 y.o. male.  HPI 27 year old AA male with no pertinent past medical history presents to the ED with complaints of nausea, diarrhea and diffuse abdominal pain.  Patient states that he ate [facility] yesterday afternoon.  States that approximately 12:00 last night he developed diffuse abdominal pain and cramping.  The pain does not radiate.  Not associated with food.  Nothing makes better or worse.  Reports associated diarrhea without any melena or hematochezia.  Patient reports nausea but denies any emesis.  He has not taking for symptoms prior to arrival.  Nothing makes better or worse.  The pain is intermittent.  Denies any known sick contacts.  Reports passing gas.  Pt denies any fever, chill, ha, vision changes, lightheadedness, dizziness, congestion, neck pain, cp, sob, cough, urinary symptoms, change in bowel habits, melena, hematochezia, lower extremity paresthesias.  History reviewed. No pertinent past medical history.  There are no active problems to display for this patient.   Past Surgical History:  Procedure Laterality Date  . FRACTURE SURGERY  1998   left hand       Home Medications    Prior to Admission medications   Medication Sig Start Date End Date Taking? Authorizing Provider  dicyclomine (BENTYL) 20 MG tablet Take 1 tablet (20 mg total) by mouth 2 (two) times daily. 09/23/17   Demetrios Loll T, PA-C  ondansetron (ZOFRAN ODT) 4 MG disintegrating tablet Take 1 tablet (4 mg total) by mouth every 8 (eight) hours as needed for nausea or vomiting. 09/23/17   Rise Mu, PA-C    Family History No family history on file.  Social History Social History   Tobacco Use  . Smoking status: Never Smoker  . Smokeless tobacco: Never Used   Substance Use Topics  . Alcohol use: No  . Drug use: No     Allergies   Patient has no known allergies.   Review of Systems Review of Systems  Constitutional: Negative for chills and fever.  HENT: Negative for congestion and sore throat.   Eyes: Negative for visual disturbance.  Respiratory: Negative for cough and shortness of breath.   Cardiovascular: Negative for chest pain.  Gastrointestinal: Positive for abdominal pain, diarrhea and nausea. Negative for blood in stool, constipation and vomiting.  Genitourinary: Negative for dysuria, flank pain, frequency, hematuria, scrotal swelling, testicular pain and urgency.  Musculoskeletal: Negative for arthralgias and myalgias.  Skin: Negative for rash.  Neurological: Negative for dizziness, syncope, weakness, light-headedness, numbness and headaches.  Psychiatric/Behavioral: Negative for sleep disturbance. The patient is not nervous/anxious.      Physical Exam Updated Vital Signs BP 135/80   Pulse 87   Temp 98.4 F (36.9 C) (Oral)   Resp 16   Ht 6' (1.829 m)   Wt 99.8 kg (220 lb)   SpO2 100%   BMI 29.84 kg/m   Physical Exam  Constitutional: He is oriented to person, place, and time. He appears well-developed and well-nourished.  Non-toxic appearance. No distress.  HENT:  Head: Normocephalic and atraumatic.  Mouth/Throat: Oropharynx is clear and moist.  Mucous membranes moist  Eyes: Conjunctivae are normal. Pupils are equal, round, and reactive to light. Right eye exhibits no discharge. Left eye exhibits no discharge.  Neck: Normal range of  motion. Neck supple.  Cardiovascular: Normal rate, regular rhythm, normal heart sounds and intact distal pulses. Exam reveals no gallop and no friction rub.  No murmur heard. Pulmonary/Chest: Effort normal and breath sounds normal. No stridor. No respiratory distress. He has no wheezes. He has no rales. He exhibits no tenderness.  Abdominal: Soft. Bowel sounds are normal. He exhibits  no distension. There is generalized tenderness. There is no rigidity, no rebound, no guarding, no CVA tenderness, no tenderness at McBurney's point and negative Murphy's sign.  Musculoskeletal: Normal range of motion. He exhibits no tenderness.  Lymphadenopathy:    He has no cervical adenopathy.  Neurological: He is alert and oriented to person, place, and time.  Skin: Skin is warm and dry. Capillary refill takes less than 2 seconds. No rash noted.  Good skin turgor  Psychiatric: His behavior is normal. Judgment and thought content normal.  Nursing note and vitals reviewed.    ED Treatments / Results  Labs (all labs ordered are listed, but only abnormal results are displayed) Labs Reviewed  URINALYSIS, ROUTINE W REFLEX MICROSCOPIC - Abnormal; Notable for the following components:      Result Value   Specific Gravity, Urine >1.030 (*)    Ketones, ur 15 (*)    All other components within normal limits  HEPATIC FUNCTION PANEL - Abnormal; Notable for the following components:   ALT 16 (*)    Bilirubin, Direct <0.1 (*)    All other components within normal limits  I-STAT CHEM 8, ED - Abnormal; Notable for the following components:   Creatinine, Ser 1.50 (*)    All other components within normal limits  CBC WITH DIFFERENTIAL/PLATELET  LIPASE, BLOOD    EKG  EKG Interpretation None       Radiology Dg Abdomen 1 View  Result Date: 09/23/2017 CLINICAL DATA:  Abdominal pain, bloating EXAM: ABDOMEN - 1 VIEW COMPARISON:  None. FINDINGS: The bowel gas pattern is normal. No radio-opaque calculi or other significant radiographic abnormality are seen. IMPRESSION: Negative. Electronically Signed   By: Charlett Nose M.D.   On: 09/23/2017 09:20    Procedures Procedures (including critical care time)  Medications Ordered in ED Medications  gi cocktail (Maalox,Lidocaine,Donnatal) (30 mLs Oral Given 09/23/17 0918)  ondansetron (ZOFRAN-ODT) disintegrating tablet 4 mg (4 mg Oral Given 09/23/17  1008)  dicyclomine (BENTYL) capsule 10 mg (10 mg Oral Given 09/23/17 1008)  sodium chloride 0.9 % bolus 1,000 mL (0 mLs Intravenous Stopped 09/23/17 1125)     Initial Impression / Assessment and Plan / ED Course  I have reviewed the triage vital signs and the nursing notes.  Pertinent labs & imaging results that were available during my care of the patient were reviewed by me and considered in my medical decision making (see chart for details).     Patient with symptoms consistent with viral enteritis.  Vitals are stable, no fever.    Lungs are clear.  No focal abdominal pain, no concern for appendicitis, cholecystitis, pancreatitis, ruptured viscus, UTI, kidney stone, or any other abdominal etiology.  Lab work is reassuring.  No leukocytosis.  Lipase is normal.  Normal liver function.  He does have a mildly elevated creatinine 1.5 with history of same.  Prior creatinine was 1.7.  Unsure patient's baseline.  May be due to mild dehydration.  Patient given fluids in the ED.  Able to tolerate p.o. fluids on emesis.  Instructed patient to have this followed up with a primary care doctor 1-2 weeks to make  sure the kidney function is improving.  UA does not show any signs of infection.  KUB shows no obstructive pattern.  Patient improved with medications in the ED.  Tolerating p.o. fluids appropriately.  On repeat exam patient has no focal abdominal tenderness and no signs of peritonitis.  Supportive therapy indicated with return if symptoms worsen.  Pt is hemodynamically stable, in NAD, & able to ambulate in the ED. Evaluation does not show pathology that would require ongoing emergent intervention or inpatient treatment. I explained the diagnosis to the patient. Pain has been managed & has no complaints prior to dc. Pt is comfortable with above plan and is stable for discharge at this time. All questions were answered prior to disposition. Strict return precautions for f/u to the ED were discussed. Encouraged  follow up with PCP.    Final Clinical Impressions(s) / ED Diagnoses   Final diagnoses:  Diarrhea, unspecified type  Generalized abdominal pain    ED Discharge Orders        Ordered    dicyclomine (BENTYL) 20 MG tablet  2 times daily     09/23/17 1216    ondansetron (ZOFRAN ODT) 4 MG disintegrating tablet  Every 8 hours PRN     09/23/17 1216       Wallace KellerLeaphart, Quincee Gittens T, PA-C 09/23/17 1639    Azalia Bilisampos, Kevin, MD 09/23/17 2229

## 2017-09-23 NOTE — ED Notes (Signed)
Sprite provided for po challenge 

## 2017-09-23 NOTE — ED Triage Notes (Signed)
Pt c/o abd pain with diarrhea.  

## 2018-09-13 IMAGING — CR DG ABDOMEN 1V
2 series · 2 of 2 positions shown · non-contrast
Comparison: None.

CLINICAL DATA: Abdominal pain, bloating

EXAM:
ABDOMEN - 1 VIEW

[t abdomen supine (1 of 2)]
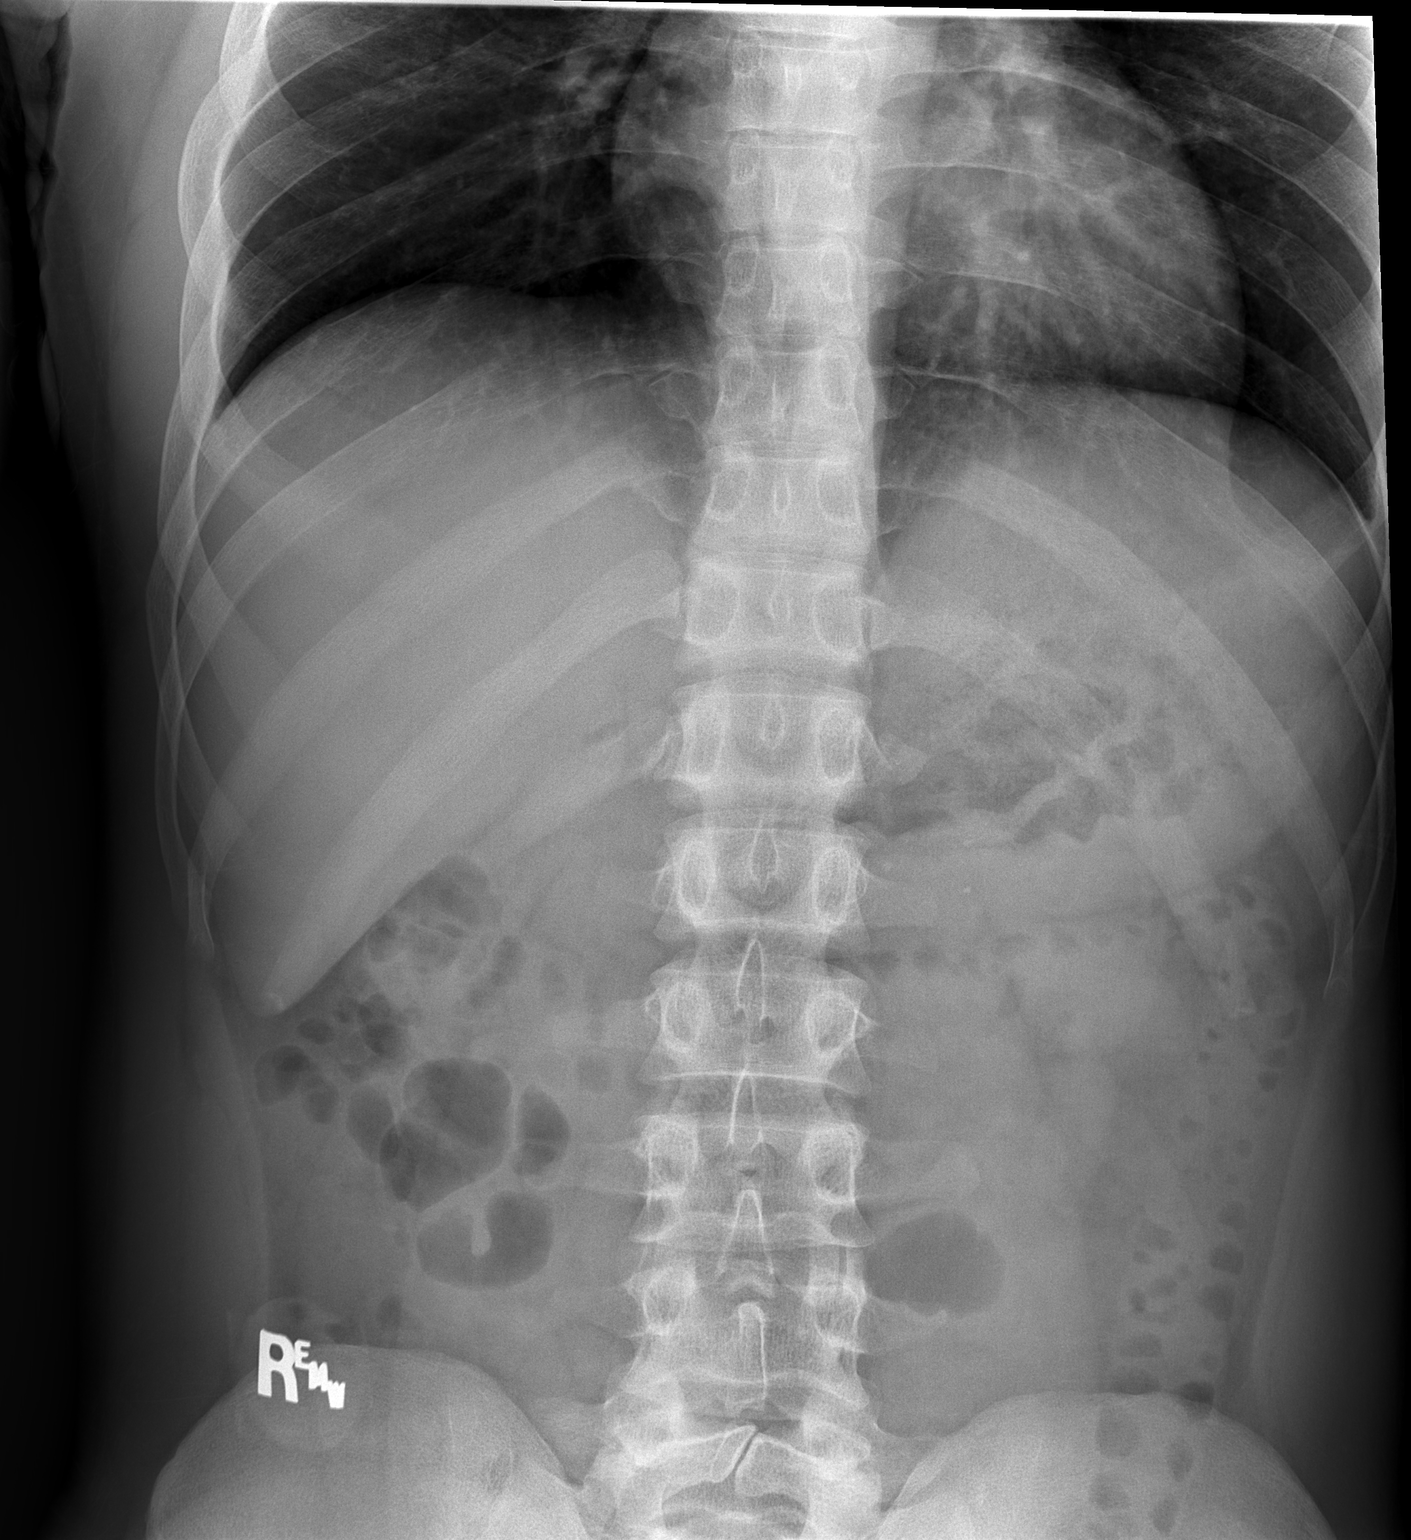

[t abdomen supine (2 of 2)]
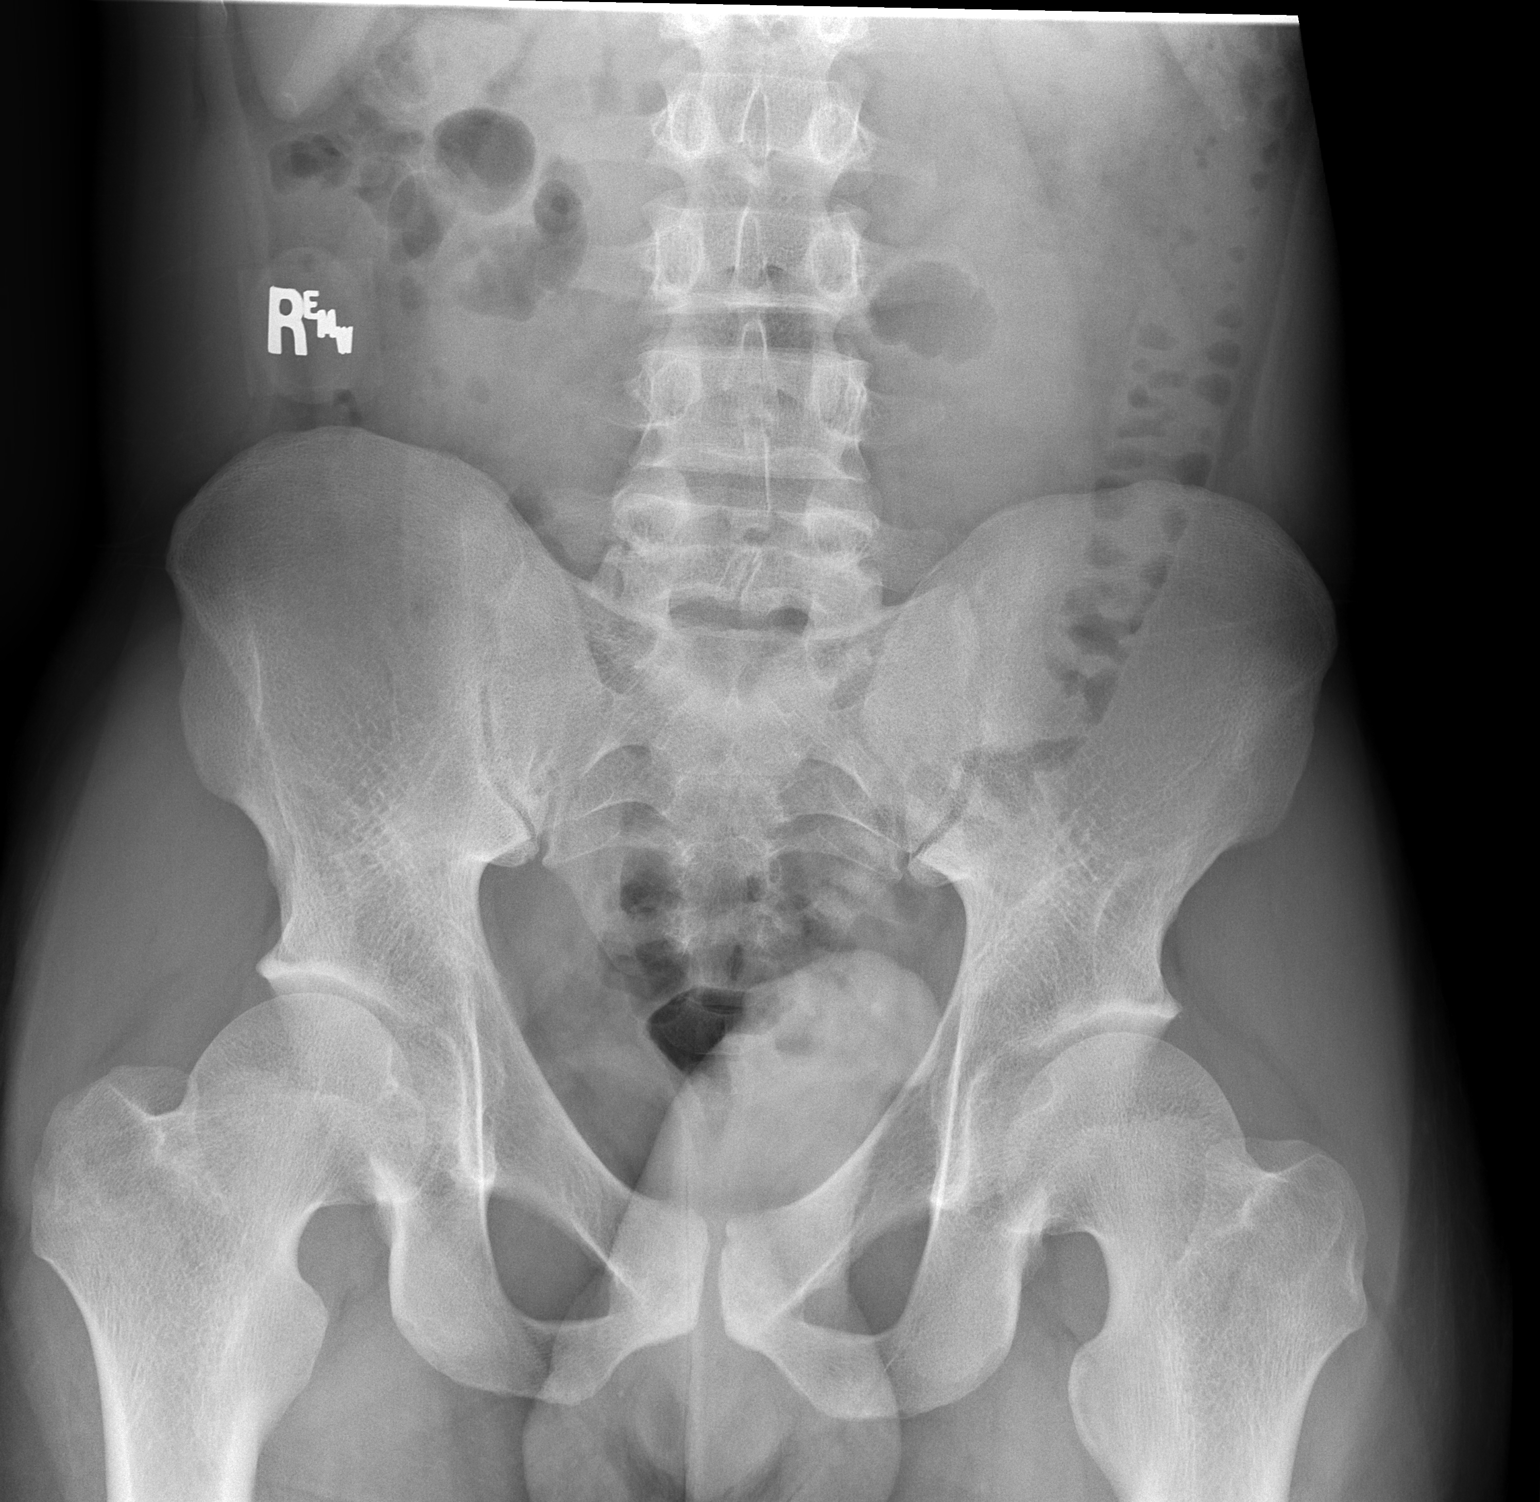

[2 of 2 positions shown; findings below may reference images not displayed]

FINDINGS: The bowel gas pattern is normal. No radio-opaque calculi or other
significant radiographic abnormality are seen.
IMPRESSION: Negative.
